# Patient Record
Sex: Female | Born: 1995 | Race: Black or African American | Hispanic: No | Marital: Single | Smoking: Never smoker
Health system: Southern US, Community
[De-identification: ages and names within clinical notes are randomized; demographics above are authoritative.]

## PROBLEM LIST (undated history)

## (undated) ENCOUNTER — Inpatient Hospital Stay (HOSPITAL_COMMUNITY): Payer: Self-pay

## (undated) DIAGNOSIS — I10 Essential (primary) hypertension: Secondary | ICD-10-CM

## (undated) HISTORY — PX: TENDON REPAIR: SHX5111

---

## 2016-01-09 DIAGNOSIS — I10 Essential (primary) hypertension: Secondary | ICD-10-CM | POA: Diagnosis not present

## 2016-01-09 DIAGNOSIS — Z3A33 33 weeks gestation of pregnancy: Secondary | ICD-10-CM | POA: Diagnosis not present

## 2016-01-10 ENCOUNTER — Emergency Department (HOSPITAL_COMMUNITY)
Admission: EM | Admit: 2016-01-10 | Discharge: 2016-01-10 | Disposition: A | Discharge status: Home / Self Care | Payer: Medicaid Other | Attending: Emergency Medicine | Admitting: Emergency Medicine

## 2016-01-10 ENCOUNTER — Encounter (HOSPITAL_COMMUNITY): Payer: Self-pay

## 2016-01-10 DIAGNOSIS — O4703 False labor before 37 completed weeks of gestation, third trimester: Secondary | ICD-10-CM

## 2016-01-10 HISTORY — DX: Essential (primary) hypertension: I10

## 2016-01-10 MED ORDER — LACTATED RINGERS IV BOLUS (SEPSIS)
1000.0000 mL | Freq: Once | INTRAVENOUS | Status: AC
  Administered 2016-01-10: 1000 mL via INTRAVENOUS

## 2016-01-10 MED ORDER — NIFEDIPINE 10 MG PO CAPS
10.0000 mg | ORAL_CAPSULE | Freq: Once | ORAL | Status: AC
  Administered 2016-01-10: 10 mg via ORAL
  Filled 2016-01-10: qty 1

## 2016-01-10 NOTE — ED Provider Notes (Signed)
[facility]-EMERGENCY DEPT Provider Note   CSN: [id number] Arrival date & time: 01/09/16  2359  First Provider Contact:  First MD Initiated Contact with Patient 01/10/16 0008     By signing my name below, I, Octavia Heir, attest that this documentation has been prepared under the direction and in the presence of Derwood Kaplan, MD.  Electronically Signed: Octavia Heir, ED Scribe. 01/10/16. 12:16 AM.    History   Chief Complaint Chief Complaint  Patient presents with  . Contractions    The history is provided by the patient. No language interpreter was used.   HPI Comments: Tammy Mora is a 20 y.o. female who is currently [redacted] weeks pregnant presents to the Emergency Department presenting with contractions.This is pt's first pregnancy. Pt says she began to feel contractions about 1 hour ag after having sexual intercourse and they are occurring every 5 minutes. Pt reports associated dysuria after having intercourse. She notes having the pain in her upper and lower abdomen. Pt has had no complications throughout her pregnancy. Denies vaginal bleeding, vaginal discharge, or any amniotic fluid release. Pt says she currently takes pre-natal vitamins and Zoloft.  Past Medical History:  Diagnosis Date  . Hypertension    prior to becoming pregnant    There are no active problems to display for this patient.   Past Surgical History:  Procedure Laterality Date  . TENDON REPAIR Right    right hand    OB History    Gravida Para Term Preterm AB Living   2 0 0 0 1 0   SAB TAB Ectopic Multiple Live Births                   Home Medications    Prior to Admission medications   Not on File    Family History No family history on file.  Social History Social History  Substance Use Topics  . Smoking status: Never Smoker  . Smokeless tobacco: Never Used  . Alcohol use Not on file     Allergies   Review of patient's allergies indicates no known allergies.   Review of  Systems Review of Systems A complete 10 system review of systems was obtained and all systems are negative except as noted in the HPI and PMH.    Physical Exam Updated Vital Signs BP 122/81 (BP Location: Left Arm)   Pulse 71   Temp 98.7 F (37.1 C) (Oral)   Resp 21   SpO2 100%   Physical Exam  Constitutional: She is oriented to person, place, and time. She appears well-developed and well-nourished.  HENT:  Head: Normocephalic.  Eyes: EOM are normal.  Neck: Normal range of motion.  Pulmonary/Chest: Effort normal.  Abdominal: She exhibits no distension.  Musculoskeletal: Normal range of motion.  Neurological: She is alert and oriented to person, place, and time.  Psychiatric: She has a normal mood and affect.  Nursing note and vitals reviewed.    ED Treatments / Results  DIAGNOSTIC STUDIES: Oxygen Saturation is 100% on RA, normal by my interpretation.  COORDINATION OF CARE:  12:11 AM Discussed treatment plan which includes calling rapid OB with pt at bedside and pt agreed to plan.  Labs (all labs ordered are listed, but only abnormal results are displayed) Labs Reviewed - No data to display  EKG  EKG Interpretation None       Radiology No results found.  Procedures Procedures (including critical care time)  Medications Ordered in ED Medications  NIFEdipine (PROCARDIA) capsule  10 mg (10 mg Oral Given 01/10/16 0126)  lactated ringers bolus 1,000 mL (1,000 mLs Intravenous New Bag/Given 01/10/16 0110)     Initial Impression / Assessment and Plan / ED Course  I have reviewed the triage vital signs and the nursing notes.  Pertinent labs & imaging results that were available during my care of the patient were reviewed by me and considered in my medical decision making (see chart for details).  Clinical Course  Comment By Time  OB team clears the patient. Pelvic rest encouraged. Stable for d/c. Derwood KaplanAnkit Sahej Schrieber, MD 08/06 [phone]      Final Clinical Impressions(s)  / ED Diagnoses   Final diagnoses:  Preterm contractions, third trimester   I personally performed the services described in this documentation, which was scribed in my presence. The recorded information has been reviewed and is accurate.  Pt comes in with contractions. G1P1. No uti like symptoms, no rupture of membranes, no bleeding. She had intercourse prior to the contractions - and we suspect that she is likely not in labor. OB team to assess the patient.   New Prescriptions New Prescriptions   No medications on file     Derwood KaplanAnkit Viva Gallaher, MD 01/10/16 [phone]

## 2016-01-10 NOTE — ED Notes (Signed)
This note also relates to the following rows which could not be included: Pulse Rate - Cannot attach notes to unvalidated device data SpO2 - Cannot attach notes to unvalidated device data  RROB in to see pt that gets her care in [place]; she states that she is [redacted]weeks pregnant; G2P0, 1SAB; pt reports contractions for the past hour post intercourse; no vaginal bleeding, no leaking of fluid, feeling baby move; pt rates her pain as a 4, in her abdomen and feeling pressure in vaginal and rectal area; pt reports no health problems and no problems with her pregnancy

## 2016-01-10 NOTE — ED Notes (Signed)
This note also relates to the following rows which could not be included: Pulse Rate - Cannot attach notes to unvalidated device data SpO2 - Cannot attach notes to unvalidated device data  RROB spoke with Dr Shawnie PonsPratt, [facility]; told of pt at 33 1/7, first baby, no health problems or pregnancy problems; pt with contractions every 1-2 min; fhr reactive and reassuring, cervix closed/60/posterior; orders received for procardia 10mg  po, lr fluid bolus, recheck cervix in one hour, if no change in cervix then pt may be discharged

## 2016-01-10 NOTE — ED Notes (Signed)
Patient d/c'd self care.  F/U reviewed.  Patient verbalized understanding. 

## 2016-01-10 NOTE — Discharge Instructions (Addendum)
Call Monday 01/11/16 to schedule an appointment [facility]. If you have any concerns related to your pregnancy, then come to [facility] at [address] [place]

## 2016-01-10 NOTE — ED Triage Notes (Signed)
Patient states was laying down after being intimate and began to have contractions x1 hour ago.  Patient states that contractions are about 5 minutes apart having "crampy" pain and tightening in the stomach.   Patient is [redacted] weeks pregnant, due date September 23.  Patient denies water breaking or bloody discharge.  Patient denies pain at this time.

## 2016-01-11 ENCOUNTER — Encounter (HOSPITAL_COMMUNITY): Payer: Self-pay | Admitting: *Deleted

## 2016-01-11 ENCOUNTER — Inpatient Hospital Stay (HOSPITAL_COMMUNITY)
Admission: AD | Admit: 2016-01-11 | Discharge: 2016-01-11 | Disposition: A | Discharge status: Home / Self Care | Payer: Medicaid Other | Source: Ambulatory Visit | Attending: Obstetrics & Gynecology | Admitting: Obstetrics & Gynecology

## 2016-01-11 ENCOUNTER — Inpatient Hospital Stay (HOSPITAL_COMMUNITY): Payer: Medicaid Other

## 2016-01-11 DIAGNOSIS — O10913 Unspecified pre-existing hypertension complicating pregnancy, third trimester: Secondary | ICD-10-CM | POA: Diagnosis not present

## 2016-01-11 DIAGNOSIS — O4693 Antepartum hemorrhage, unspecified, third trimester: Secondary | ICD-10-CM

## 2016-01-11 DIAGNOSIS — O468X3 Other antepartum hemorrhage, third trimester: Secondary | ICD-10-CM | POA: Diagnosis present

## 2016-01-11 DIAGNOSIS — Z3A33 33 weeks gestation of pregnancy: Secondary | ICD-10-CM | POA: Diagnosis not present

## 2016-01-11 LAB — WET PREP, GENITAL
Clue Cells Wet Prep HPF POC: NONE SEEN
Sperm: NONE SEEN
Trich, Wet Prep: NONE SEEN
Yeast Wet Prep HPF POC: NONE SEEN

## 2016-01-11 LAB — URINALYSIS, ROUTINE W REFLEX MICROSCOPIC
Bilirubin Urine: NEGATIVE
Glucose, UA: NEGATIVE mg/dL
Ketones, ur: NEGATIVE mg/dL
Leukocytes, UA: NEGATIVE
Nitrite: NEGATIVE
Protein, ur: NEGATIVE mg/dL
Specific Gravity, Urine: <1.005 — ABNORMAL LOW (ref 1.005–1.030)
pH: 6.5 (ref 5.0–8.0)

## 2016-01-11 LAB — URINE MICROSCOPIC-ADD ON

## 2016-01-11 LAB — FETAL FIBRONECTIN: Fetal Fibronectin: NEGATIVE

## 2016-01-11 NOTE — MAU Note (Signed)
Care in [place]

## 2016-01-11 NOTE — Discharge Instructions (Signed)
Vaginal Bleeding During Pregnancy, Third Trimester °A small amount of bleeding (spotting) from the vagina is relatively common in pregnancy. Various things can cause bleeding or spotting in pregnancy. Sometimes the bleeding is normal and is not a problem. However, bleeding during the third trimester can also be a sign of something serious for the mother and the baby. Be sure to tell your health care provider about any vaginal bleeding right away.  °Some possible causes of vaginal bleeding during the third trimester include:  °· The placenta may be partially or completely covering the opening to the cervix (placenta previa).   °· The placenta may have separated from the uterus (abruption of the placenta).   °· There may be an infection or growth on the cervix.   °· You may be starting labor, called discharging of the mucus plug.   °· The placenta may grow into the muscle layer of the uterus (placenta accreta).   °HOME CARE INSTRUCTIONS  °Watch your condition for any changes. The following actions may help to lessen any discomfort you are feeling:  °· Follow your health care provider's instructions for limiting your activity. If your health care provider orders bed rest, you may need to stay in bed and only get up to use the bathroom. However, your health care provider may allow you to continue light activity. °· If needed, make plans for someone to help with your regular activities and responsibilities while you are on bed rest. °· Keep track of the number of pads you use each day, how often you change pads, and how soaked (saturated) they are. Write this down. °· Do not use tampons. Do not douche. °· Do not have sexual intercourse or orgasms until approved by your health care provider. °· Follow your health care provider's advice about lifting, driving, and physical activities. °· If you pass any tissue from your vagina, save the tissue so you can show it to your health care provider.   °· Only take over-the-counter  or prescription medicines as directed by your health care provider. °· Do not take aspirin because it can make you bleed.   °· Keep all follow-up appointments as directed by your health care provider. °SEEK MEDICAL CARE IF: °· You have any vaginal bleeding during any part of your pregnancy. °· You have cramps or labor pains. °· You have a fever, not controlled by medicine. °SEEK IMMEDIATE MEDICAL CARE IF:  °· You have severe cramps or pain in your back or belly (abdomen). °· You have chills. °· You have a gush of fluid from the vagina. °· You pass large clots or tissue from your vagina. °· Your bleeding increases. °· You feel light-headed or weak. °· You pass out. °· You feel less movement or no movement of the baby.   °MAKE SURE YOU: °· Understand these instructions. °· Will watch your condition. °· Will get help right away if you are not doing well or get worse. °  °This information is not intended to replace advice given to you by your health care provider. Make sure you discuss any questions you have with your health care provider. °  °Document Released: 08/13/2002 Document Revised: 05/28/2013 Document Reviewed: 01/28/2013 °Elsevier Interactive Patient Education ©2016 [employer] ° °

## 2016-01-11 NOTE — MAU Note (Signed)
Was spotting earlier and then passed a blood clot, nickel sized.  Denies previa or low lying placenta. First episode of bleeding

## 2016-01-11 NOTE — MAU Provider Note (Signed)
  History     CSN: [id number]  Arrival date and time: 01/11/16 1740   First Provider Initiated Contact with Patient 01/11/16 1825      Chief Complaint  Patient presents with  . Vaginal Bleeding   HPI  20 yo G1P0 female presents for vaginal spotting and one "stringy" clot.  Pt seen at ED yesterday and checked several times.  Was found not to be in labor.  Pt had sex prior to contractionx >24 hours ago.  No recent intercourse.     Past Medical History:  Diagnosis Date  . Hypertension    prior to becoming pregnant    Past Surgical History:  Procedure Laterality Date  . TENDON REPAIR Right    right hand    History reviewed. No pertinent family history.  Social History  Substance Use Topics  . Smoking status: Never Smoker  . Smokeless tobacco: Never Used  . Alcohol use No    Allergies: No Known Allergies  Prescriptions Prior to Admission  Medication Sig Dispense Refill Last Dose  . Prenatal Vit-Fe Fumarate-FA (PRENATAL MULTIVITAMIN) TABS tablet Take 1 tablet by mouth daily at 12 noon.   01/11/2016 at Unknown time  . sertraline (ZOLOFT) 50 MG tablet Take 50 mg by mouth daily.   01/11/2016 at Unknown time    Review of Systems  Constitutional: Negative.   Respiratory: Negative.   Cardiovascular: Negative.   Gastrointestinal: Negative.   Genitourinary:       Vaginal spotting  Skin: Negative.   Neurological: Negative.   Endo/Heme/Allergies: Negative.   Psychiatric/Behavioral: Negative.    Physical Exam   Blood pressure 129/83, pulse 79, temperature 98.9 F (37.2 C), temperature source Oral, resp. rate 16, weight 151 lb 9.6 oz (68.8 kg).  Physical Exam  Vitals reviewed. Constitutional: She is oriented to person, place, and time. She appears well-developed and well-nourished. No distress.  HENT:  Head: Normocephalic and atraumatic.  Eyes: Conjunctivae are normal.  Neck: Neck supple. No thyromegaly present.  Cardiovascular: Normal rate.   Respiratory: Effort  normal.  GI: Soft. There is no tenderness. There is no rebound and no guarding.  Genitourinary:  Genitourinary Comments: Vulva: No lesion, Tanner 5 Vagina: No blood in vault, thick white discharge adherent to the vaginal wall Cervix: Visually closed; 1 friable area around 6:00;  digital exam pending ultrasound  Musculoskeletal: Normal range of motion. She exhibits no tenderness.  Neurological: She is alert and oriented to person, place, and time.  Skin: Skin is warm and dry.  Psychiatric: She has a normal mood and affect.    MAU Course  Procedures  MDM R/o previa and abruption and preterm labor  Assessment and Plan  20 yo female with scant vaginal spotting with no bleeding now. Prelim US shows no previa or abruption ?post coital and post exam bleeding?  1-ABO RN sent 2-Pt encouraged to keep prenatal care in [place] 3-cervix is closed and long with tone.  Oberia Beaudoin H. 01/11/2016, 6:42 PM

## 2016-01-12 LAB — ABO/RH: ABO/RH(D): A POS

## 2016-01-12 LAB — GC/CHLAMYDIA PROBE AMP (~~LOC~~) NOT AT ARMC
Chlamydia: NEGATIVE
Neisseria Gonorrhea: NEGATIVE

## 2016-11-14 ENCOUNTER — Encounter (HOSPITAL_COMMUNITY): Payer: Self-pay

## 2017-01-29 ENCOUNTER — Encounter (HOSPITAL_COMMUNITY): Payer: Self-pay | Admitting: Emergency Medicine

## 2017-01-29 DIAGNOSIS — Z5321 Procedure and treatment not carried out due to patient leaving prior to being seen by health care provider: Secondary | ICD-10-CM | POA: Insufficient documentation

## 2017-01-29 DIAGNOSIS — R1030 Lower abdominal pain, unspecified: Secondary | ICD-10-CM | POA: Diagnosis present

## 2017-01-29 LAB — CBC
HCT: 38.6 % (ref 36.0–46.0)
Hemoglobin: 13.2 g/dL (ref 12.0–15.0)
MCH: 29.3 pg (ref 26.0–34.0)
MCHC: 34.2 g/dL (ref 30.0–36.0)
MCV: 85.8 fL (ref 78.0–100.0)
Platelets: 340 10*3/uL (ref 150–400)
RBC: 4.5 MIL/uL (ref 3.87–5.11)
RDW: 12.4 % (ref 11.5–15.5)
WBC: 7.9 10*3/uL (ref 4.0–10.5)

## 2017-01-29 LAB — COMPREHENSIVE METABOLIC PANEL
ALT: 13 U/L — ABNORMAL LOW (ref 14–54)
AST: 17 U/L (ref 15–41)
Albumin: 4.2 g/dL (ref 3.5–5.0)
Alkaline Phosphatase: 49 U/L (ref 38–126)
Anion gap: 7 (ref 5–15)
BUN: 14 mg/dL (ref 6–20)
CO2: 25 mmol/L (ref 22–32)
Calcium: 9.5 mg/dL (ref 8.9–10.3)
Chloride: 106 mmol/L (ref 101–111)
Creatinine, Ser: 0.73 mg/dL (ref 0.44–1.00)
GFR calc Af Amer: >60 mL/min (ref >60)
GFR calc non Af Amer: >60 mL/min (ref >60)
Glucose, Bld: 81 mg/dL (ref 65–99)
Potassium: 4 mmol/L (ref 3.5–5.1)
Sodium: 138 mmol/L (ref 135–145)
Total Bilirubin: 0.5 mg/dL (ref 0.3–1.2)
Total Protein: 7.5 g/dL (ref 6.5–8.1)

## 2017-01-29 LAB — URINALYSIS, ROUTINE W REFLEX MICROSCOPIC
Bilirubin Urine: NEGATIVE
Glucose, UA: NEGATIVE mg/dL
Hgb urine dipstick: NEGATIVE
Ketones, ur: NEGATIVE mg/dL
Leukocytes, UA: NEGATIVE
Nitrite: NEGATIVE
Protein, ur: NEGATIVE mg/dL
Specific Gravity, Urine: 1.015 (ref 1.005–1.030)
pH: 7.5 (ref 5.0–8.0)

## 2017-01-29 LAB — LIPASE, BLOOD: Lipase: 38 U/L (ref 11–51)

## 2017-01-29 LAB — POC URINE PREG, ED: Preg Test, Ur: NEGATIVE

## 2017-01-29 NOTE — ED Notes (Addendum)
Pt c/o 7/10 lower abdominal pain onset 3 months ago. Pt stated her last period was two months ago and bleeding has persisted since then. Pt has a Nexplanon birth control device implanted in her left arm since last October.

## 2017-01-29 NOTE — ED Notes (Signed)
Pt from home with c/o lower left intermittent abdominal pain x 3 months. Pt denies n/v/d. Pt states she has had vaginal bleeding x 2 months. Pt has the birth control implant in her arm and has since last October.

## 2017-01-30 ENCOUNTER — Emergency Department (HOSPITAL_COMMUNITY)
Admission: EM | Admit: 2017-01-30 | Discharge: 2017-01-30 | Discharge status: Against Medical Advice | Payer: Medicaid Other | Attending: Emergency Medicine | Admitting: Emergency Medicine

## 2018-01-04 IMAGING — US US MFM OB LIMITED
1 series · 15 of 27 positions shown · non-contrast
Comparison: none

[Series 1: us mfm ob limited · 15 of 27 slices shown]
[im 1/27]
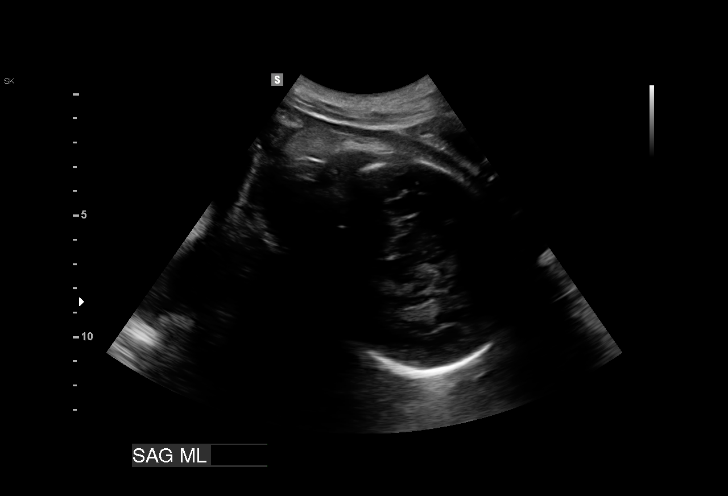
[im 3/27]
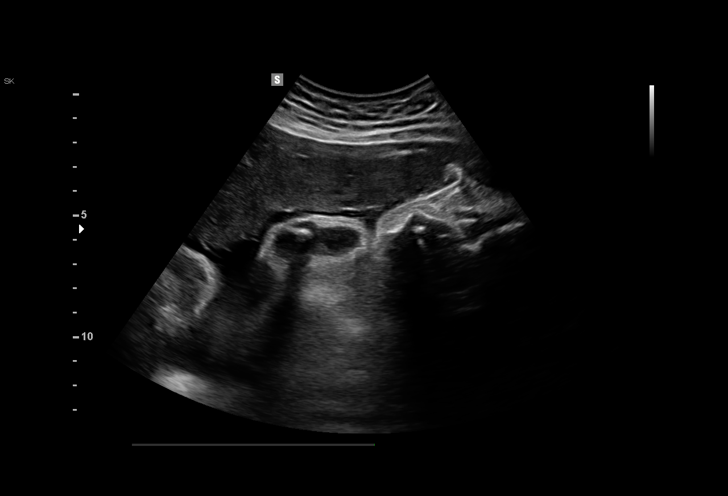
[im 5/27]
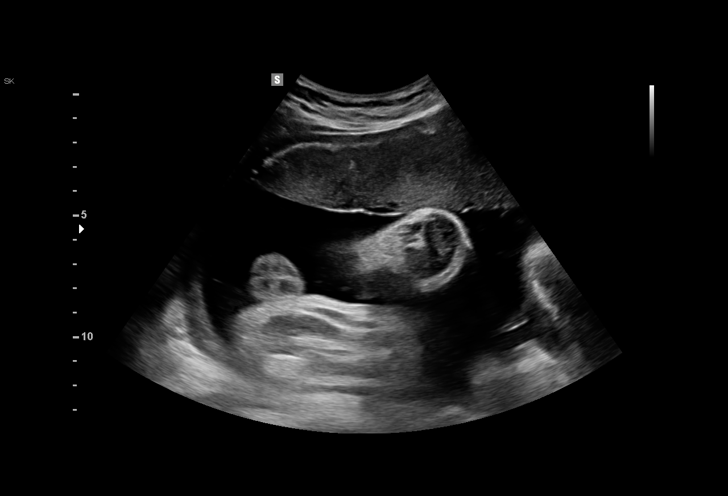
[im 7/27]
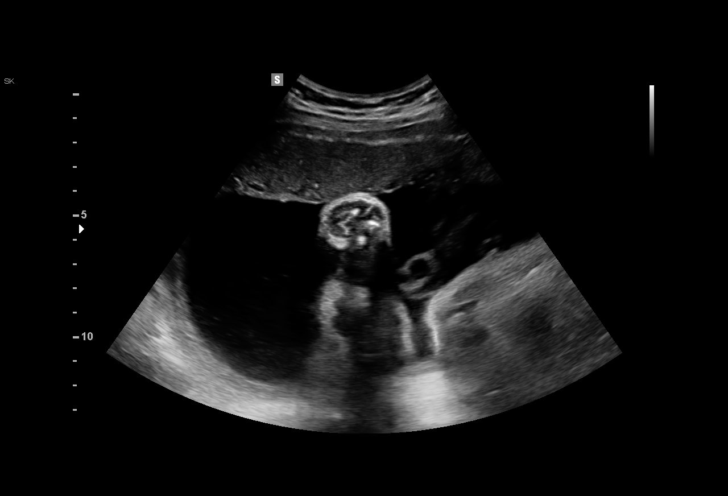
[im 9/27]
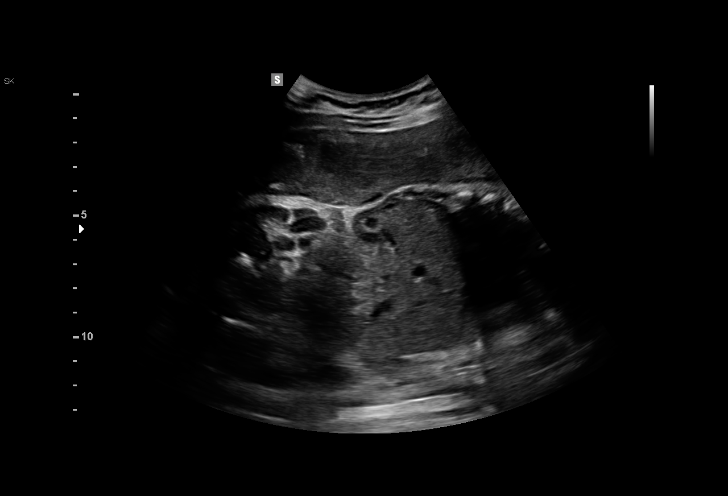
[im 10/27]
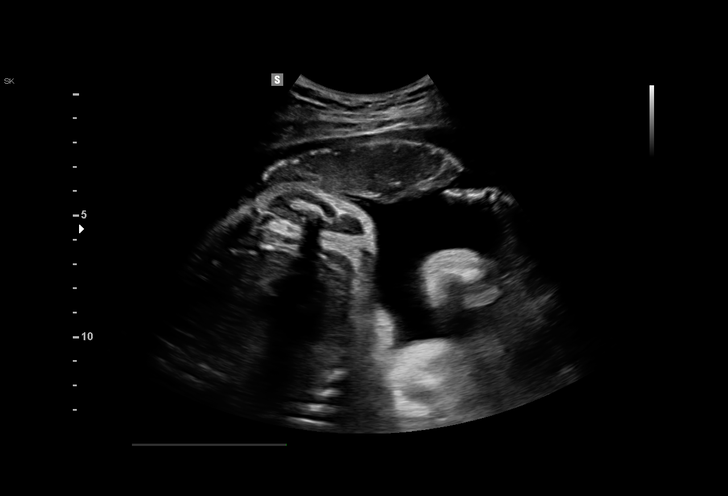
[im 12/27]
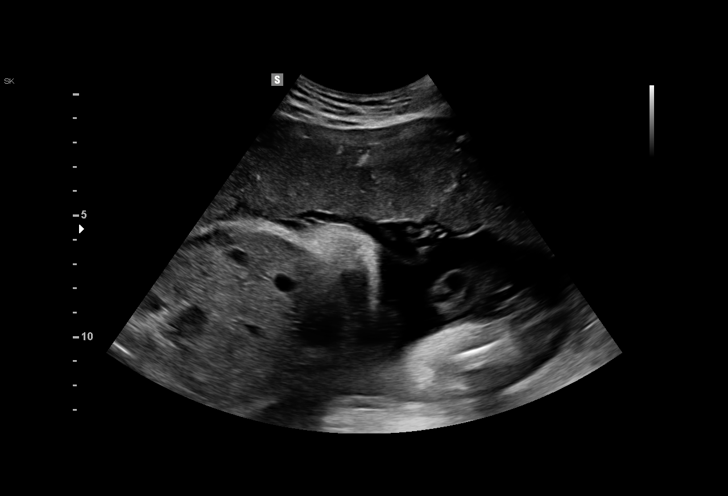
[im 14/27]
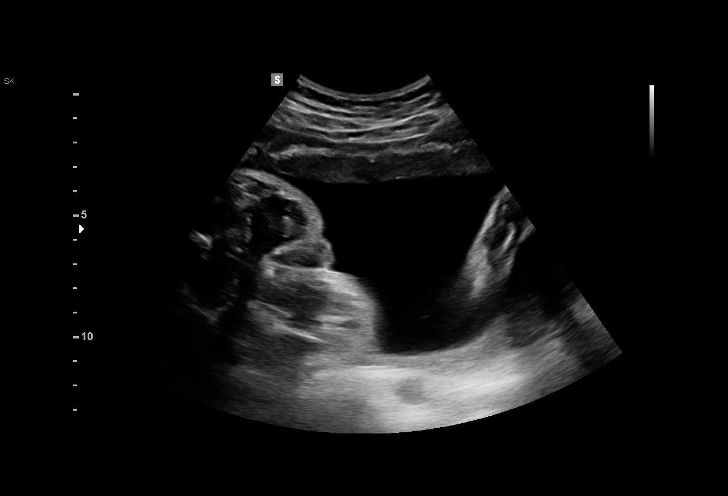
[im 16/27]
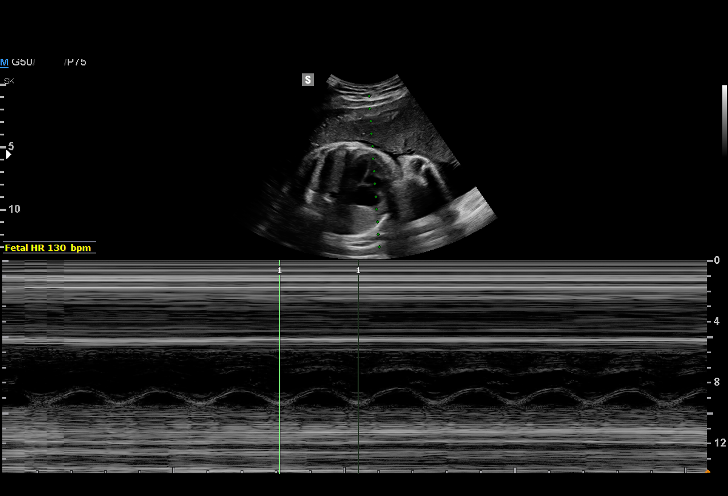
[im 18/27]
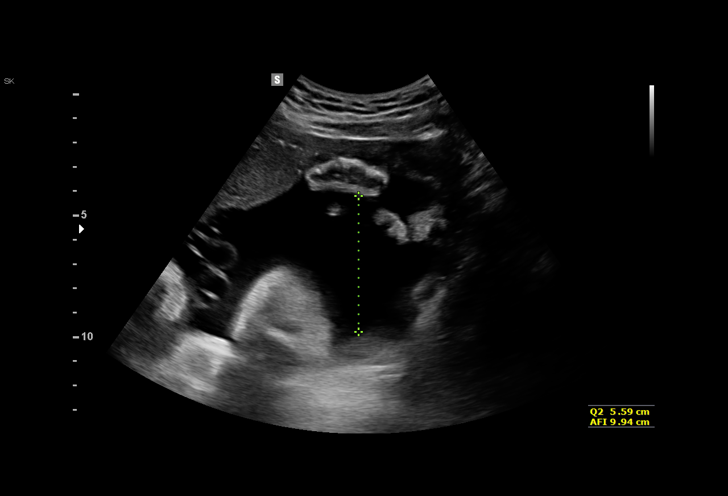
[im 19/27]
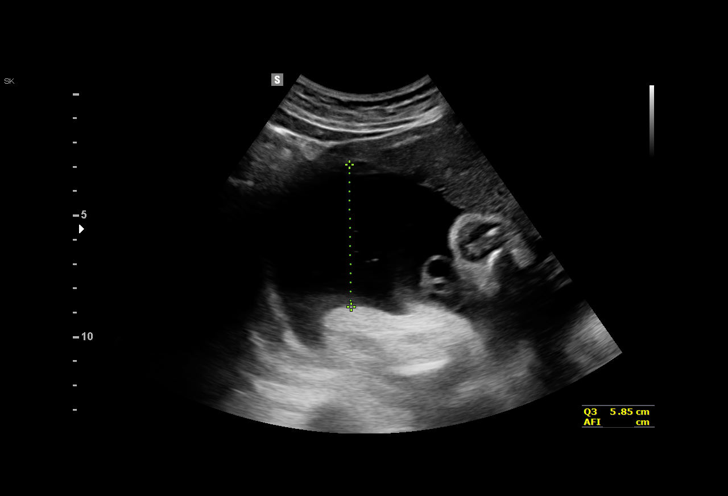
[im 21/27]
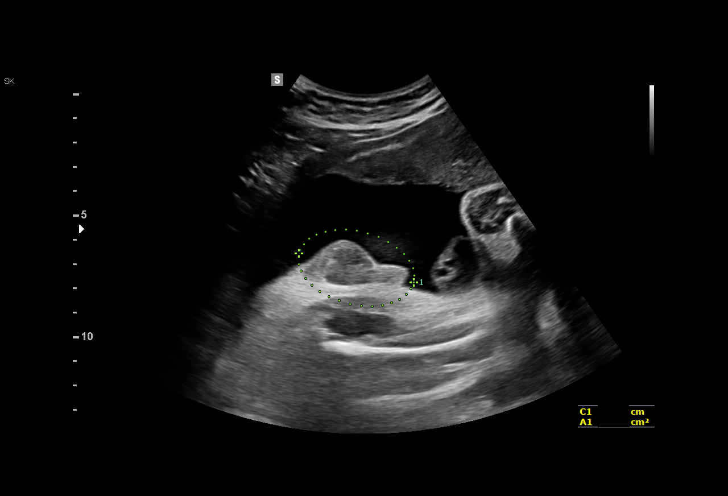
[im 23/27]
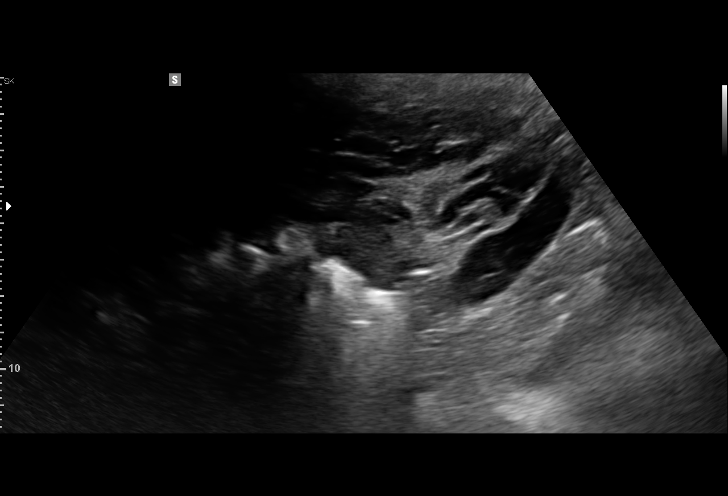
[im 25/27]
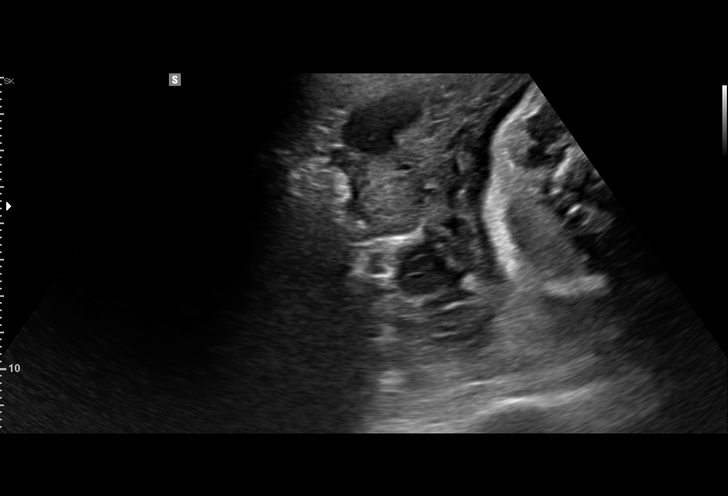
[im 27/27]
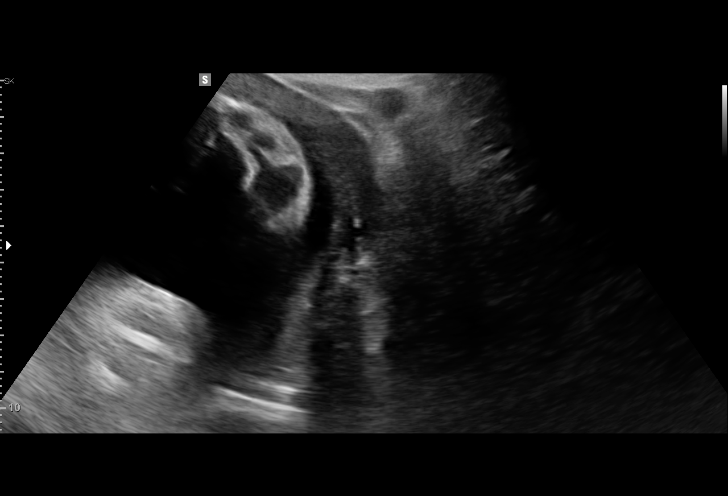

[15 of 27 positions shown; findings below may reference images not displayed]

1  GEGAJ SYLLA            [id number]      [id number]     [id number]
Indications

33 weeks gestation of pregnancy
Vaginal bleeding in pregnancy, third trimester
OB History

Gravidity:    2         Term:   0         SAB:   1
Living:       0
Fetal Evaluation

Num Of Fetuses:     1
Fetal Heart         130
Rate(bpm):
Cardiac Activity:   Observed
Presentation:       Cephalic
Placenta:           Anterior, above cervical os

Amniotic Fluid
AFI FV:      Subjectively within normal limits

AFI Sum(cm)     %Tile       Largest Pocket(cm)
19.44           73

RUQ(cm)       RLQ(cm)       LUQ(cm)        LLQ(cm)
4.35

Comment:    No placental abruption or previa identified.
Gestational Age
Clinical EDD:  33w 2d                                        EDD:   02/27/16
Best:          33w 2d    Det. By:   Clinical EDD             EDD:   02/27/16
Cervix Uterus Adnexa

Cervix
Not visualized (advanced GA >04wks)

Left Ovary
Not visualized.

Right Ovary
Within normal limits.

Cul De Sac:   No free fluid seen.
Impression

Single IUP at 33w 2d
Limited ultrasound performed due to vaginal bleeding
Cephalic presentation
Anterior placenta without previa
No sonographic findings suggestive of abruption noted
Normal amniotic fluid volume
Recommendations

Follow-up ultrasounds as clinically indicated.

## 2020-04-03 ENCOUNTER — Emergency Department
Admission: EM | Admit: 2020-04-03 | Discharge: 2020-04-03 | Disposition: A | Discharge status: Home Health | Payer: BC Managed Care – PPO | Attending: Emergency Medicine | Admitting: Emergency Medicine

## 2020-04-03 ENCOUNTER — Other Ambulatory Visit: Payer: Self-pay

## 2020-04-03 DIAGNOSIS — R070 Pain in throat: Secondary | ICD-10-CM | POA: Diagnosis present

## 2020-04-03 DIAGNOSIS — I10 Essential (primary) hypertension: Secondary | ICD-10-CM | POA: Diagnosis not present

## 2020-04-03 DIAGNOSIS — Z20822 Contact with and (suspected) exposure to covid-19: Secondary | ICD-10-CM | POA: Diagnosis not present

## 2020-04-03 DIAGNOSIS — J020 Streptococcal pharyngitis: Secondary | ICD-10-CM | POA: Diagnosis not present

## 2020-04-03 LAB — RESPIRATORY PANEL BY RT PCR (FLU A&B, COVID)
Influenza A by PCR: NEGATIVE
Influenza B by PCR: NEGATIVE
SARS Coronavirus 2 by RT PCR: NEGATIVE

## 2020-04-03 LAB — GROUP A STREP BY PCR: Group A Strep by PCR: DETECTED — AB

## 2020-04-03 MED ORDER — CEPHALEXIN 500 MG PO CAPS: 500.0000 mg | ORAL_CAPSULE | Freq: Three times a day (TID) | ORAL | 0 refills | Status: AC

## 2020-04-03 MED ORDER — CEPHALEXIN 500 MG PO CAPS
500.0000 mg | ORAL_CAPSULE | Freq: Once | ORAL | Status: AC
  Administered 2020-04-03: 500 mg via ORAL
  Filled 2020-04-03: qty 1

## 2020-04-03 NOTE — ED Provider Notes (Signed)
[facility] Emergency Department Provider Note  Time seen: 7:40 AM  I have reviewed the triage vital signs and the nursing notes.   HISTORY  Chief Complaint Sore Throat and Otalgia   HPI Tammy Mora is a 24 y.o. female with a past medical history of hypertension presents to the emergency department for sore throat.  According to the patient over the past 5 to 6 days she has had a significant sore throat.  Approximately 4 days ago went to her doctor had a strep swab that was negative at that time.  Has been using over-the-counter medications without relief.  Subjective fever.  No nausea or vomiting.  No cough or shortness of breath.  Patient has been Covid vaccinated.   Past Medical History:  Diagnosis Date  . Hypertension    prior to becoming pregnant    There are no problems to display for this patient.   Past Surgical History:  Procedure Laterality Date  . TENDON REPAIR Right    right hand    Prior to Admission medications   Medication Sig Start Date End Date Taking? Authorizing Provider  cephALEXin (KEFLEX) 500 MG capsule Take 1 capsule (500 mg total) by mouth 3 (three) times daily. 04/03/20   Minna Antis, MD  Prenatal Vit-Fe Fumarate-FA (PRENATAL MULTIVITAMIN) TABS tablet Take 1 tablet by mouth daily at 12 noon.    [provider]  sertraline (ZOLOFT) 50 MG tablet Take 50 mg by mouth daily.    [provider]    No Known Allergies  No family history on file.  Social History Social History   Tobacco Use  . Smoking status: Never Smoker  . Smokeless tobacco: Never Used  Substance Use Topics  . Alcohol use: No  . Drug use: No    Review of Systems Constitutional: Subjective fever at home. ENT: Positive sore throat Cardiovascular: Negative for chest pain. Respiratory: Negative for shortness of breath. Gastrointestinal: Negative for abdominal pain, vomiting Musculoskeletal: Negative for musculoskeletal  complaints Neurological: Negative for headache All other ROS negative  ____________________________________________   PHYSICAL EXAM:  VITAL SIGNS: ED Triage Vitals  Enc Vitals Group     BP 04/03/20 0133 (!) 155/89     Pulse Rate 04/03/20 0133 98     Resp 04/03/20 0133 16     Temp 04/03/20 0133 99.2 F (37.3 C)     Temp Source 04/03/20 0133 Oral     SpO2 04/03/20 0133 98 %     Weight 04/03/20 0134 136 lb 7.4 oz (61.9 kg)     Height 04/03/20 0134 5\' 6"  (1.676 m)     Head Circumference --      Peak Flow --      Pain Score 04/03/20 0134 7     Pain Loc --      Pain Edu? --      Excl. in GC? --    Constitutional: Alert and oriented. Well appearing and in no distress. Eyes: Normal exam ENT      Head: Normocephalic and atraumatic.      Mouth/Throat: Mucous membranes are moist.  Bilateral pharyngeal erythema but no exudates noted.  No unilateral tonsillar hypertrophy, mild bilateral hypertrophy.  No uvular deviation. Cardiovascular: Normal rate, regular rhythm.  Respiratory: Normal respiratory effort without tachypnea nor retractions. Breath sounds are clear Gastrointestinal: Soft and nontender. No distention. Musculoskeletal: Nontender with normal range of motion in all extremities.  Neurologic:  Normal speech and language. No gross focal neurologic deficits  Skin:  Skin is warm, dry and intact.  Psychiatric: Mood and affect are normal.   ____________________________________________    INITIAL IMPRESSION / ASSESSMENT AND PLAN / ED COURSE  Pertinent labs & imaging results that were available during my care of the patient were reviewed by me and considered in my medical decision making (see chart for details).   Patient presents emergency department for sore throat x5 days.  Patient does have bilateral tonsillar erythema with no uvular deviation no unilateral hypertrophy.  Clear lung sounds, no cough reported.  Covid test is negative, flu test is negative but strep test is  positive.  Likely strep pharyngitis.  We will place the patient on Keflex 3 times daily x7 days.  I discussed return precautions for any worsening pain, trouble swallowing, unilateral hypertrophy uvular deviation.  Patient agreeable to plan of care.  Tammy Mora was evaluated in Emergency Department on 04/03/2020 for the symptoms described in the history of present illness. She was evaluated in the context of the global COVID-19 pandemic, which necessitated consideration that the patient might be at risk for infection with the SARS-CoV-2 virus that causes COVID-19. Institutional protocols and algorithms that pertain to the evaluation of patients at risk for COVID-19 are in a state of rapid change based on information released by regulatory bodies including the CDC and federal and state organizations. These policies and algorithms were followed during the patient's care in the ED.  ____________________________________________   FINAL CLINICAL IMPRESSION(S) / ED DIAGNOSES  Strep pharyngitis   Minna Antis, MD 04/03/20 [id number]

## 2020-04-03 NOTE — ED Triage Notes (Signed)
Pt to ED reporting left ear pain and sore throat since Sunday. [facility] and [facility] St[facility] but the test resulted negative. Pt was not prescribed any medication and has since developed more pain in her throat and ear. No white patches seen in throat upon assessment but pt verbalized willingness for another strep test.   Fevers started at home per pt. Afebrile upon arrival to ED without medications taken at home.
